# Patient Record
Sex: Male | Born: 1985 | Race: White | Hispanic: No | Marital: Single | Smoking: Never smoker
Health system: Southern US, Community
[De-identification: ages and names within clinical notes are randomized; demographics above are authoritative.]

## PROBLEM LIST (undated history)

## (undated) DIAGNOSIS — F329 Major depressive disorder, single episode, unspecified: Secondary | ICD-10-CM

## (undated) DIAGNOSIS — F419 Anxiety disorder, unspecified: Secondary | ICD-10-CM

## (undated) DIAGNOSIS — N23 Unspecified renal colic: Secondary | ICD-10-CM

## (undated) DIAGNOSIS — F32A Depression, unspecified: Secondary | ICD-10-CM

## (undated) DIAGNOSIS — A6000 Herpesviral infection of urogenital system, unspecified: Secondary | ICD-10-CM

## (undated) HISTORY — DX: Depression, unspecified: F32.A

## (undated) HISTORY — DX: Unspecified renal colic: N23

## (undated) HISTORY — PX: WISDOM TOOTH EXTRACTION: SHX21

## (undated) HISTORY — DX: Major depressive disorder, single episode, unspecified: F32.9

## (undated) HISTORY — DX: Herpesviral infection of urogenital system, unspecified: A60.00

## (undated) HISTORY — DX: Anxiety disorder, unspecified: F41.9

---

## 2005-01-08 ENCOUNTER — Ambulatory Visit: Payer: Self-pay | Admitting: Internal Medicine

## 2005-10-12 DIAGNOSIS — N23 Unspecified renal colic: Secondary | ICD-10-CM

## 2005-10-12 HISTORY — DX: Unspecified renal colic: N23

## 2005-10-19 ENCOUNTER — Ambulatory Visit: Payer: Self-pay | Admitting: Internal Medicine

## 2006-11-01 ENCOUNTER — Encounter: Payer: Self-pay | Admitting: Internal Medicine

## 2006-11-01 ENCOUNTER — Ambulatory Visit: Payer: Self-pay | Admitting: Internal Medicine

## 2006-11-04 ENCOUNTER — Ambulatory Visit: Payer: Self-pay | Admitting: Internal Medicine

## 2007-11-16 ENCOUNTER — Ambulatory Visit: Payer: Self-pay | Admitting: Internal Medicine

## 2007-11-16 DIAGNOSIS — J069 Acute upper respiratory infection, unspecified: Secondary | ICD-10-CM | POA: Insufficient documentation

## 2007-12-20 ENCOUNTER — Ambulatory Visit: Payer: Self-pay | Admitting: Internal Medicine

## 2007-12-20 DIAGNOSIS — F341 Dysthymic disorder: Secondary | ICD-10-CM

## 2010-06-24 ENCOUNTER — Ambulatory Visit
Admission: RE | Admit: 2010-06-24 | Discharge: 2010-06-24 | Payer: Self-pay | Source: Home / Self Care | Attending: Internal Medicine | Admitting: Internal Medicine

## 2010-06-24 DIAGNOSIS — R51 Headache: Secondary | ICD-10-CM | POA: Insufficient documentation

## 2010-06-24 DIAGNOSIS — R519 Headache, unspecified: Secondary | ICD-10-CM | POA: Insufficient documentation

## 2010-06-24 DIAGNOSIS — J3489 Other specified disorders of nose and nasal sinuses: Secondary | ICD-10-CM | POA: Insufficient documentation

## 2010-07-16 NOTE — Assessment & Plan Note (Signed)
Summary: sinuses//ccm   Vital Signs:  Weber profile:   25 year old male Weight:      178 pounds Temp:     97.9 degrees F oral BP sitting:   120 / 80  (right arm) Cuff size:   regular  Vitals Entered By: Duard Brady LPN (June 24, 2010 10:05 AM) CC: c/o sinus congestion - pressure and headache Is Weber Diabetic? No   CC:  c/o sinus congestion - pressure and headache.  History of Present Illness: Nathan Weber, who presents with a 5 to 6 week history for significant sinus congestion and pressure.  He has had significant headaches.  He has a history of allergic rhinitis in the past.  He denies any fever or purulent sinus drainage.  His main complaint is significant sinus congestion with associated pain.  He has been using over-the-counter decongestants, as well as nasal irrigation with saline.  Allergies: 1)  ! * Acne Meds  Past History:  Past Medical History: Reviewed history from 12/20/2007 and no changes required. anxiety depression renal colic, May 2007 genital HSV  Review of Systems       The Weber complains of headaches.  The Weber denies anorexia, fever, weight loss, weight gain, vision loss, decreased hearing, hoarseness, chest pain, syncope, dyspnea on exertion, peripheral edema, prolonged cough, hemoptysis, abdominal pain, melena, hematochezia, severe indigestion/heartburn, hematuria, incontinence, genital sores, muscle weakness, suspicious skin lesions, transient blindness, difficulty walking, depression, unusual weight change, abnormal bleeding, enlarged lymph nodes, angioedema, breast masses, and testicular masses.    Physical Exam  General:  Well-developed,well-nourished,in no acute distress; alert,appropriate and cooperative throughout examination Head:  Normocephalic and atraumatic without obvious abnormalities. No apparent alopecia or balding. Eyes:  No corneal or conjunctival inflammation noted. EOMI. Perrla. Funduscopic exam benign,  without hemorrhages, exudates or papilledema. Vision grossly normal. Ears:  External ear exam shows no significant lesions or deformities.  Otoscopic examination reveals clear canals, tympanic membranes are intact bilaterally without bulging, retraction, inflammation or discharge. Hearing is grossly normal bilaterally. right canal with cerumen Nose:  External nasal examination shows no deformity or inflammation. Nasal mucosa are pink and moist without lesions or exudates. Mouth:  Oral mucosa and oropharynx without lesions or exudates.  Teeth in good repair. Neck:  No deformities, masses, or tenderness noted. Lungs:  Normal respiratory effort, chest expands symmetrically. Lungs are clear to auscultation, no crackles or wheezes.   Impression & Recommendations:  Problem # 1:  SINUS PAIN (ICD-478.19) will treat with prednisone for 10 days, and a tapering dose.  Will continue oral decongestants and had a nasal steroid.  Will also treat with short term after an  Problem # 2:  HEADACHE (ICD-784.0)  His updated medication list for this problem includes:    Aleve 220 Mg Tabs (Naproxen sodium) .Marland Kitchen... As needed  Complete Medication List: 1)  Aleve 220 Mg Tabs (Naproxen sodium) .... As needed 2)  Fluticasone Propionate 50 Mcg/act Susp (Fluticasone propionate) .... Use daily 3)  Sertraline Hcl 50 Mg Tabs (Sertraline hcl) .... One daily 4)  Prednisone 20 Mg Tabs (Prednisone) .... One twice daily for 5 days, then one tablet daily 5)  Nasonex 50 Mcg/act Susp (Mometasone furoate) .... Used each nares daily  Weber Instructions: 1)  continue Advil sinus and nasal irrigation with saline 2)  use Afrin twice daily for 5 days 3)  Call  call if no improvement Prescriptions: PREDNISONE 20 MG TABS (PREDNISONE) one twice daily for 5 days, then one tablet daily  #15  x 0   Entered and Authorized by:   Gordy Savers  MD   Signed by:   Gordy Savers  MD on 06/24/2010   Method used:   Print then Give  to Weber   RxID:   [id number] PREDNISONE 20 MG TABS (PREDNISONE) one twice daily for 5 days, then one tablet daily  #15 x 0   Entered and Authorized by:   Gordy Savers  MD   Signed by:   Gordy Savers  MD on 06/24/2010   Method used:   Electronically to        [facility]  [phone] (retail)       [address]       [place], [place]  [address]       Ph: [phone]       Fax: [phone]   RxID:   [id number]    Orders Added: 1)  Est. Weber Level III [[id number]]

## 2010-07-22 ENCOUNTER — Telehealth: Payer: Self-pay | Admitting: *Deleted

## 2010-07-22 DIAGNOSIS — J329 Chronic sinusitis, unspecified: Secondary | ICD-10-CM

## 2010-07-22 NOTE — Telephone Encounter (Signed)
Requesting an ENT referral and LMTCB with reason.

## 2010-07-22 NOTE — Telephone Encounter (Signed)
Antibiotics did not help his sinus infection at all.

## 2010-07-22 NOTE — Telephone Encounter (Signed)
Dr. Kirtland Bouchard probably needs to evaluate---he may want other testing first

## 2010-07-23 NOTE — Telephone Encounter (Signed)
Left message for pt to expect a call with appt for CT scan.

## 2010-07-23 NOTE — Telephone Encounter (Signed)
Please schedule limited sinus CT scan and depending on the results will refer to allergy or ENT

## 2010-07-29 ENCOUNTER — Inpatient Hospital Stay: Admission: RE | Admit: 2010-07-29 | Payer: Self-pay | Source: Ambulatory Visit

## 2010-07-30 ENCOUNTER — Ambulatory Visit (INDEPENDENT_AMBULATORY_CARE_PROVIDER_SITE_OTHER)
Admission: RE | Admit: 2010-07-30 | Discharge: 2010-07-30 | Disposition: A | Discharge status: Home / Self Care | Payer: BC Managed Care – PPO | Source: Ambulatory Visit | Attending: Internal Medicine | Admitting: Internal Medicine

## 2010-07-30 DIAGNOSIS — J329 Chronic sinusitis, unspecified: Secondary | ICD-10-CM

## 2010-07-30 NOTE — Progress Notes (Signed)
Quick Note:  Please advise - this was in my in basket - unsure if it has been reviewed ______

## 2010-07-30 NOTE — Progress Notes (Signed)
Quick Note:  Spoke with pt - informed ______

## 2011-06-30 ENCOUNTER — Encounter: Payer: Self-pay | Admitting: Internal Medicine

## 2011-07-01 ENCOUNTER — Ambulatory Visit (INDEPENDENT_AMBULATORY_CARE_PROVIDER_SITE_OTHER): Payer: BC Managed Care – PPO | Admitting: Internal Medicine

## 2011-07-01 ENCOUNTER — Encounter: Payer: Self-pay | Admitting: Internal Medicine

## 2011-07-01 DIAGNOSIS — R109 Unspecified abdominal pain: Secondary | ICD-10-CM

## 2011-07-01 MED ORDER — HYOSCYAMINE SULFATE 0.125 MG SL SUBL: 0.1250 mg | SUBLINGUAL_TABLET | SUBLINGUAL | Status: DC | PRN

## 2011-07-01 NOTE — Progress Notes (Signed)
  Subjective:    Patient ID: Nathan Weber, male    DOB: September 07, 1985, 26 y.o.   MRN: [id number]  HPI  26 year old patient who presents today with a chief complaint of abdominal pain this is in the lower mid abdominal area and is described as sharp and at times crampy he states that he had similar episodes approximately twice per year. He describes nocturnal pain and had 2 days of nausea. Total duration of pain 7 days. There's been no fever vomiting or change in his bowel habits. He does have a history of renal colic. The past 2 days he has been much improved. He suggests that the pain may be related to stress    Review of Systems  Constitutional: Negative for fever, chills, appetite change and fatigue.  HENT: Negative for hearing loss, ear pain, congestion, sore throat, trouble swallowing, neck stiffness, dental problem, voice change and tinnitus.   Eyes: Negative for pain, discharge and visual disturbance.  Respiratory: Negative for cough, chest tightness, wheezing and stridor.   Cardiovascular: Negative for chest pain, palpitations and leg swelling.  Gastrointestinal: Positive for nausea and abdominal pain. Negative for vomiting, diarrhea, constipation, blood in stool and abdominal distention.  Genitourinary: Negative for urgency, hematuria, flank pain, discharge, difficulty urinating and genital sores.  Musculoskeletal: Negative for myalgias, back pain, joint swelling, arthralgias and gait problem.  Skin: Negative for rash.  Neurological: Negative for dizziness, syncope, speech difficulty, weakness, numbness and headaches.  Hematological: Negative for adenopathy. Does not bruise/bleed easily.  Psychiatric/Behavioral: Negative for behavioral problems and dysphoric mood. The patient is not nervous/anxious.        Objective:   Physical Exam  Constitutional: He is oriented to person, place, and time. He appears well-developed.  HENT:  Head: Normocephalic.  Right Ear: External ear normal.    Left Ear: External ear normal.  Eyes: Conjunctivae and EOM are normal.  Neck: Normal range of motion.  Cardiovascular: Normal rate and normal heart sounds.   Pulmonary/Chest: Breath sounds normal.  Abdominal: Soft. Bowel sounds are normal. He exhibits no distension and no mass. There is no tenderness. There is no rebound and no guarding.  Musculoskeletal: Normal range of motion. He exhibits no edema and no tenderness.  Neurological: He is alert and oriented to person, place, and time.  Psychiatric: He has a normal mood and affect. His behavior is normal.          Assessment & Plan:   Abdominal pain largely resolved.  We'll encourage a high-fiber diet at this point and clinically observed. Was given a prescription for Levsin if pain recurs

## 2011-07-01 NOTE — Patient Instructions (Signed)
High fiber diet  Call or return to clinic prn if these symptoms worsen or fail to improve as anticipated.  

## 2011-07-28 ENCOUNTER — Ambulatory Visit: Payer: BC Managed Care – PPO | Admitting: Internal Medicine

## 2011-08-11 ENCOUNTER — Encounter: Payer: Self-pay | Admitting: Gastroenterology

## 2011-08-11 ENCOUNTER — Ambulatory Visit (INDEPENDENT_AMBULATORY_CARE_PROVIDER_SITE_OTHER): Payer: BC Managed Care – PPO | Admitting: Gastroenterology

## 2011-08-11 ENCOUNTER — Other Ambulatory Visit (INDEPENDENT_AMBULATORY_CARE_PROVIDER_SITE_OTHER): Payer: BC Managed Care – PPO

## 2011-08-11 DIAGNOSIS — R109 Unspecified abdominal pain: Secondary | ICD-10-CM

## 2011-08-11 LAB — COMPREHENSIVE METABOLIC PANEL
ALT: 21 U/L (ref 0–53)
AST: 23 U/L (ref 0–37)
Creatinine, Ser: 0.9 mg/dL (ref 0.4–1.5)
Total Bilirubin: 0.5 mg/dL (ref 0.3–1.2)

## 2011-08-11 LAB — URINALYSIS
Leukocytes, UA: NEGATIVE
Specific Gravity, Urine: 1.025 (ref 1.000–1.030)
Urine Glucose: NEGATIVE
Urobilinogen, UA: 0.2 (ref 0.0–1.0)

## 2011-08-11 LAB — CBC WITH DIFFERENTIAL/PLATELET
Basophils Absolute: 0 10*3/uL (ref 0.0–0.1)
Eosinophils Absolute: 0 10*3/uL (ref 0.0–0.7)
HCT: 45.8 % (ref 39.0–52.0)
Hemoglobin: 15.3 g/dL (ref 13.0–17.0)
Lymphs Abs: 1.2 10*3/uL (ref 0.7–4.0)
MCHC: 33.3 g/dL (ref 30.0–36.0)
Monocytes Relative: 10.6 % (ref 3.0–12.0)
Neutro Abs: 3.9 10*3/uL (ref 1.4–7.7)
RDW: 12 % (ref 11.5–14.6)

## 2011-08-11 MED ORDER — PEG-KCL-NACL-NASULF-NA ASC-C 100 G PO SOLR: 1.0000 | ORAL | Status: DC

## 2011-08-11 NOTE — Progress Notes (Signed)
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 They have a 50-month-old baby daughter  Has had about 2 months of abdominal pains.  THese are low, midline.  Hurts to wear a belt.  Pain is right at beltbuckle location.  The pain is not constant. Has occurred 4 times. The episodes can last up to a week.  Associated with frequent urinating during the pain.  The pains are usually after a BM. Has a BM daily usually but has alternating constipation/diarrhea recently.  He has noted red blood on a single occasion. No blood in urine.  About 2 hours before the pain occurs he will get hot for 15 minutes.  He is tender at the site during the pain.  In 2007 he had renal stone, left sided pain, no blood in urine. He ended up passing the stone very clearly.  Tries to stay hydrated.  He tried hyosciamine which didn't help recently but did try flexeril and that helped.     Review of systems: Pertinent positive and negative review of systems were noted in the above HPI section. Complete review of systems was performed and was otherwise normal.    Past Medical History  Diagnosis Date  . Anxiety   . Depression   . Renal colic May 2007  . Genital HSV     Past Surgical History  Procedure Date  . Wisdom tooth extraction     Current Outpatient Prescriptions  Medication Sig Dispense Refill  . hyoscyamine (LEVSIN/SL) 0.125 MG SL tablet Place 1 tablet (0.125 mg total) under the tongue every 4 (four) hours as needed for cramping.  30 tablet  2    Allergies as of 08/11/2011  . (No Known Allergies)    Family History  Problem Relation Age of Onset  . Other Mother     bladder prolapse  . Diabetes Mother   . Other Father     back surgery    History   Social History  . Marital Status: Single    Spouse Name: N/A    Number of Children: 1  . Years of Education: N/A   Occupational History  . Theatre stage manager    Social History Main Topics  . Smoking status:  Never Smoker   . Smokeless tobacco: Never Used  . Alcohol Use: No  . Drug Use: No  . Sexually Active: Not on file   Other Topics Concern  . Not on file   Social History Narrative  . No narrative on file       Physical Exam: BP 114/78  Pulse 64  Ht 6\' 2"  (1.88 m)  Wt 179 lb 9.6 oz (81.466 kg)  BMI 23.06 kg/m2 Constitutional: generally well-appearing Psychiatric: alert and oriented x3 Eyes: extraocular movements intact Mouth: oral pharynx moist, no lesions Neck: supple no lymphadenopathy Cardiovascular: heart regular rate and rhythm Lungs: clear to auscultation bilaterally Abdomen: soft, nontender, nondistended, no obvious ascites, no peritoneal signs, normal bowel sounds Extremities: no lower extremity edema bilaterally Skin: no lesions on visible extremities    Assessment and plan: 26 y.o. male with  suprapubic pain, intermittently, red blood on stool and change in bowel habits recently  He should proceed with colonoscopy given the change in bowel habits and minor red blood on stool recently. Perhaps he has colitis. Unlikely that he has neoplasm. His abdominal symptoms seem more the latter are related. The exact spot of his pain is immediately over his urinary bladder. He  is very clear about this. He has had a kidney stone in the past. Today in clinic a urinalysis and urine culture.

## 2011-08-11 NOTE — Patient Instructions (Signed)
You will have labs checked today in the basement lab.  Please head down after you check out with the front desk  (urinalysis and urine culture, cbc, cmet). You will be set up for a colonoscopy. If these tests are not helpful, then would likely arrange referral to urologist given location of your pain, history of kidney stones. A copy of this information will be made available to Dr. Amador Cunas.

## 2011-08-12 LAB — URINE CULTURE
Colony Count: NO GROWTH
Organism ID, Bacteria: NO GROWTH

## 2011-08-16 ENCOUNTER — Telehealth: Payer: Self-pay | Admitting: Gastroenterology

## 2011-08-16 DIAGNOSIS — R102 Pelvic and perineal pain: Secondary | ICD-10-CM

## 2011-08-16 NOTE — Telephone Encounter (Signed)
Spoke with patient and he has not had anymore symptoms. He states he would like to see a urologist first then do the colonoscopy if needed. He states he does not have the money to do the colonoscopy at this time. Wants to know if Dr. Christella Hartigan will refer him to a urologist. Please, advise

## 2011-08-16 NOTE — Telephone Encounter (Signed)
Ok, please refer him to [facility] urology for suprapubic pain, h/o kidney stones

## 2011-08-17 ENCOUNTER — Ambulatory Visit: Payer: BC Managed Care – PPO | Admitting: Gastroenterology

## 2011-08-17 NOTE — Telephone Encounter (Signed)
Cancelled colonoscopy. Scheduled patient at [facility] with Dr. Brunilda Payor on 08/23/11 at 12:45 PM. Patient aware.

## 2011-08-23 ENCOUNTER — Other Ambulatory Visit: Payer: BC Managed Care – PPO | Admitting: Gastroenterology

## 2013-02-10 IMAGING — CT CT PARANASAL SINUSES LIMITED
1 of 3 series · 8 of 30 positions shown, 10 images · non-contrast
Comparison: None.

CLINICAL DATA: Right-sided facial pain and congestion

CT PARANASAL SINUSES WITHOUT CONTRAST
TECHNIQUE: Multidetector CT through the paranasal sinuses was
performed using the standard protocol without intravenous contrast.

[Series 4: ltd sinus 3.0 h30s · axial · 0.29mm/px · z∈[+57,+134]mm · 8 of 15 slices shown, 10 images]
[im 2/15  brain]
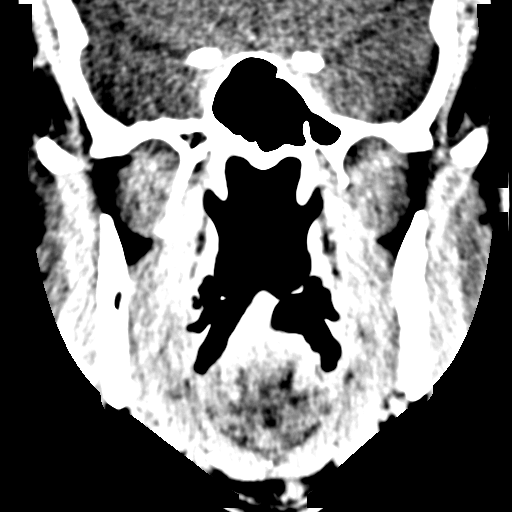
[im 2/15  bone]
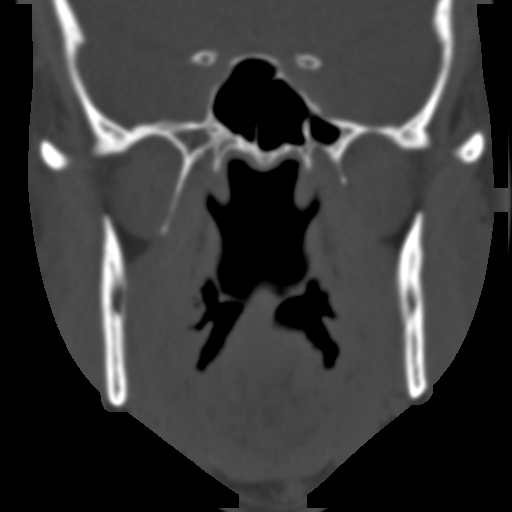
[im 4/15  bone]
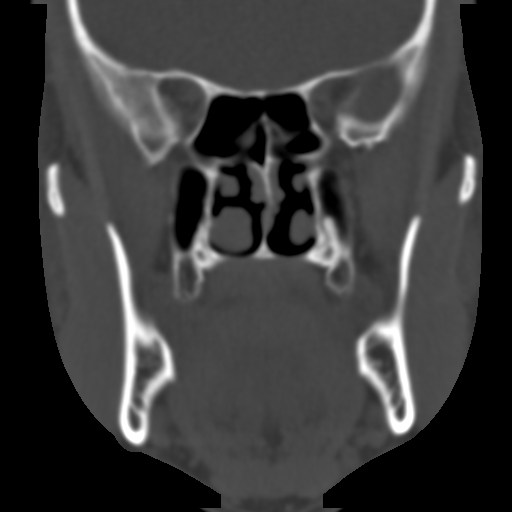
[im 5/15  bone]
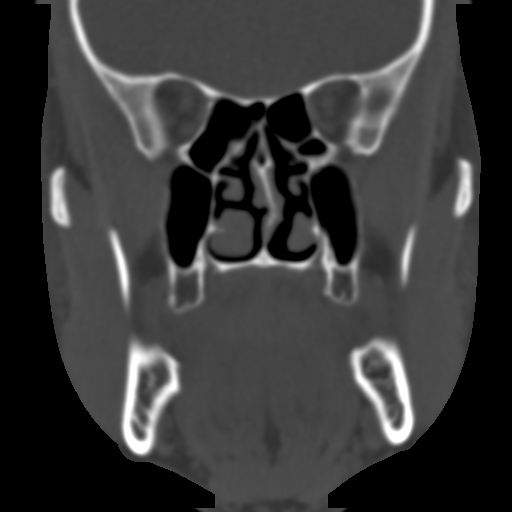
[im 7/15  bone]
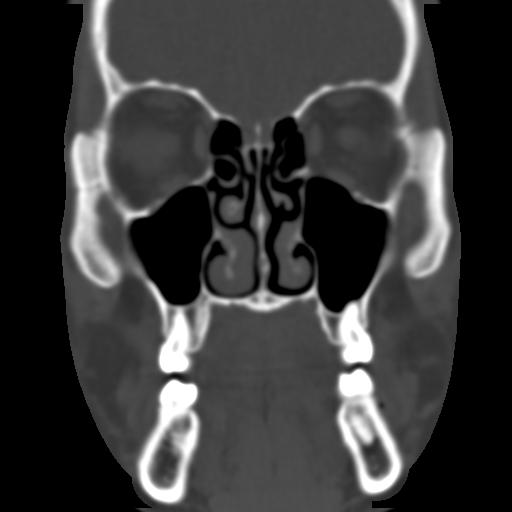
[im 8/15  brain]
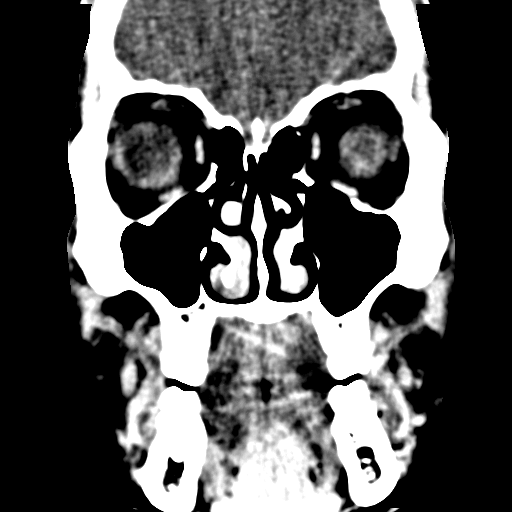
[im 8/15  bone]
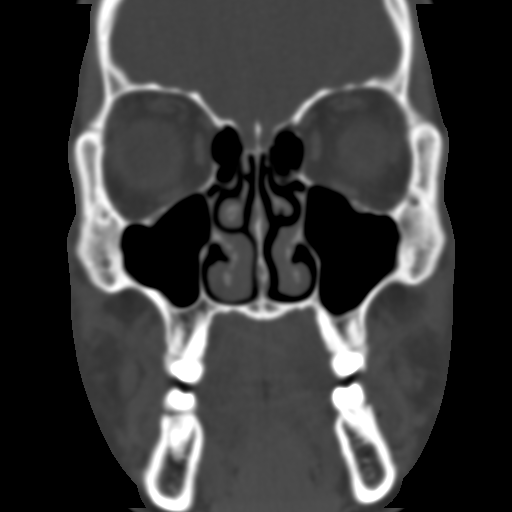
[im 10/15  bone]
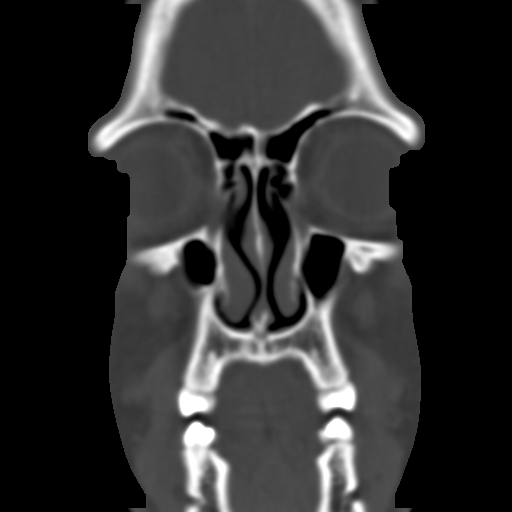
[im 11/15  bone]
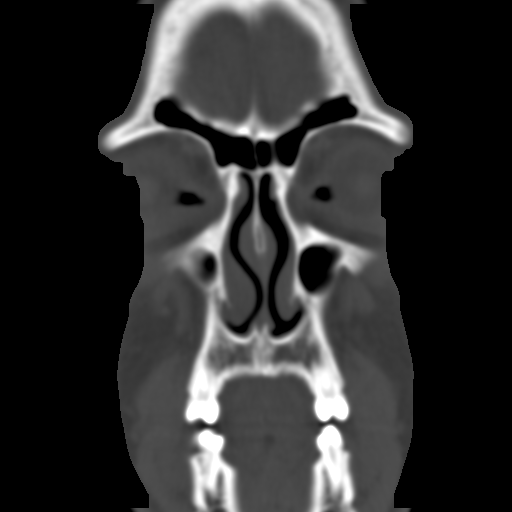
[im 13/15  bone]
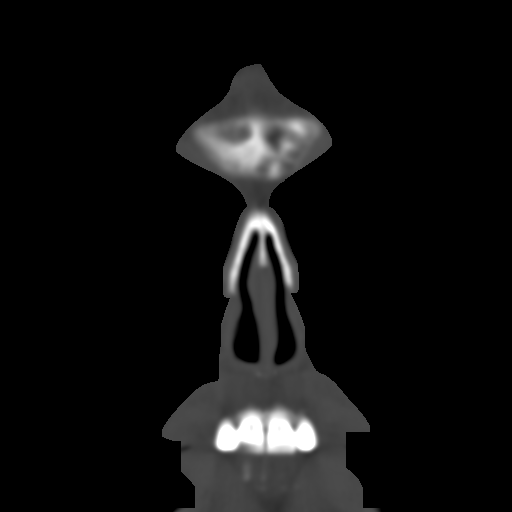

[8 of 30 positions shown; findings below may reference images not displayed]

FINDINGS: The paranasal sinuses are well pneumatized.  There is no
evidence of sinusitis.  The nasal turbinates are normal in size and
position and the nasal airway is patent.  No bony abnormality is
seen.
IMPRESSION: No evidence of sinusitis.

## 2013-12-05 ENCOUNTER — Encounter: Payer: Self-pay | Admitting: Internal Medicine

## 2013-12-05 ENCOUNTER — Ambulatory Visit (INDEPENDENT_AMBULATORY_CARE_PROVIDER_SITE_OTHER): Payer: 59 | Admitting: Internal Medicine

## 2013-12-05 VITALS — BP 112/80 | HR 92 | Temp 98.6°F | Resp 20 | Ht 74.0 in | Wt 160.0 lb

## 2013-12-05 DIAGNOSIS — F341 Dysthymic disorder: Secondary | ICD-10-CM

## 2013-12-05 DIAGNOSIS — F4323 Adjustment disorder with mixed anxiety and depressed mood: Secondary | ICD-10-CM

## 2013-12-05 MED ORDER — ESCITALOPRAM OXALATE 20 MG PO TABS: 20.0000 mg | ORAL_TABLET | Freq: Every day | ORAL | Status: DC

## 2013-12-05 MED ORDER — LORAZEPAM 0.5 MG PO TABS: 0.5000 mg | ORAL_TABLET | Freq: Two times a day (BID) | ORAL | Status: DC | PRN

## 2013-12-05 NOTE — Progress Notes (Signed)
Subjective:    Patient ID: Nathan LarssonDustin D Weber, male    DOB: Apr 27, 1986, 28 y.o.   MRN: [id number]  HPI  28 year old patient who has a history of anxiety, depression, and was last seen in 2009 for worsening depression.  He was placed on sertraline at that time, but apparently did not tolerate this medication well and did not take it for any prolonged period. The past 3 weeks.  He complains of worsening anxiety and depression.  Other complaints include insomnia, frequent intrusive thoughts, as well as anorexia.  Triggers include some marital and financial issues.  He is accompanied by his mother  Past Medical History  Diagnosis Date  . Anxiety   . Depression   . Renal colic May 2007  . Genital HSV     History   Social History  . Marital Status: Single    Spouse Name: N/A    Number of Children: 1  . Years of Education: N/A   Occupational History  . Theatre stage managerire Fighter    Social History Main Topics  . Smoking status: Never Smoker   . Smokeless tobacco: Never Used  . Alcohol Use: No  . Drug Use: No  . Sexual Activity: Not on file   Other Topics Concern  . Not on file   Social History Narrative  . No narrative on file    Past Surgical History  Procedure Laterality Date  . Wisdom tooth extraction      Family History  Problem Relation Age of Onset  . Other Mother     bladder prolapse  . Diabetes Mother   . Other Father     back surgery    No Known Allergies  No current outpatient prescriptions on file prior to visit.   No current facility-administered medications on file prior to visit.    BP 112/80  Pulse 92  Temp(Src) 98.6 F (37 C) (Oral)  Resp 20  Ht 6\' 2"  (1.88 m)  Wt 160 lb (72.576 kg)  BMI 20.53 kg/m2  SpO2 98%       Review of Systems  Constitutional: Positive for appetite change. Negative for fever, chills and fatigue.  HENT: Negative for congestion, dental problem, ear pain, hearing loss, sore throat, tinnitus, trouble swallowing and voice  change.   Eyes: Negative for pain, discharge and visual disturbance.  Respiratory: Negative for cough, chest tightness, wheezing and stridor.   Cardiovascular: Negative for chest pain, palpitations and leg swelling.  Gastrointestinal: Negative for nausea, vomiting, abdominal pain, diarrhea, constipation, blood in stool and abdominal distention.  Genitourinary: Negative for urgency, hematuria, flank pain, discharge, difficulty urinating and genital sores.  Musculoskeletal: Negative for arthralgias, back pain, gait problem, joint swelling, myalgias and neck stiffness.  Skin: Negative for rash.  Neurological: Negative for dizziness, syncope, speech difficulty, weakness, numbness and headaches.  Hematological: Negative for adenopathy. Does not bruise/bleed easily.  Psychiatric/Behavioral: Positive for sleep disturbance and dysphoric mood. Negative for suicidal ideas and behavioral problems. The patient is nervous/anxious.        Objective:   Physical Exam  Constitutional: He appears well-developed and well-nourished. No distress.  Psychiatric: His behavior is normal. Judgment and thought content normal.  Mildly depressed mood          Assessment & Plan:   Mixed  anxiety, depression/insomnia Behavioral health referral.  Discussed.  Information dispensed.  He will consider We'll start drug therapy with lorazepam  at bedtime to assist with sleep.  Also, be placed on Lexapro 10 mg every  morning.  Recheck 6 weeks or as needed

## 2013-12-05 NOTE — Patient Instructions (Signed)
Return in 6 weeks for follow up

## 2013-12-05 NOTE — Progress Notes (Signed)
Pre-visit discussion using our clinic review tool. No additional management support is needed unless otherwise documented below in the visit note.  

## 2014-01-16 ENCOUNTER — Ambulatory Visit: Payer: 59 | Admitting: Internal Medicine

## 2014-03-07 ENCOUNTER — Ambulatory Visit: Payer: 59 | Admitting: Psychology

## 2014-04-26 ENCOUNTER — Ambulatory Visit (INDEPENDENT_AMBULATORY_CARE_PROVIDER_SITE_OTHER): Payer: 59 | Admitting: Psychology

## 2014-04-26 DIAGNOSIS — F42 Obsessive-compulsive disorder: Secondary | ICD-10-CM

## 2014-07-03 ENCOUNTER — Encounter: Payer: Self-pay | Admitting: Podiatrist

## 2014-07-03 ENCOUNTER — Ambulatory Visit: Payer: Self-pay

## 2014-07-03 ENCOUNTER — Ambulatory Visit (INDEPENDENT_AMBULATORY_CARE_PROVIDER_SITE_OTHER): Payer: 59 | Admitting: Podiatrist

## 2014-07-03 DIAGNOSIS — B351 Tinea unguium: Secondary | ICD-10-CM

## 2014-07-03 DIAGNOSIS — B353 Tinea pedis: Secondary | ICD-10-CM

## 2014-07-03 MED ORDER — TERBINAFINE HCL 250 MG PO TABS: 250.0000 mg | ORAL_TABLET | Freq: Every day | ORAL | Status: AC

## 2014-07-03 NOTE — Progress Notes (Signed)
   Subjective:    Patient ID: Nathan LarssonDustin D Weber, male    DOB: [birth year], 29 y.o.   MRN: [id number]  HPI Comments: "I have bad toenails"  Patient c/o thick, discolored toenails left foot for several years. The skin on the bottom is itching and red. Tried several OTC meds-no help.     Review of Systems  HENT: Positive for sinus pressure.   Allergic/Immunologic: Positive for environmental allergies.  All other systems reviewed and are negative.      Objective:   Physical Exam Patient is awake, alert, and oriented x 3.  In no acute distress.  Vascular status is intact with palpable pedal pulses at 2/4 DP and PT bilateral and capillary refill time within normal limits. Neurological sensation is also intact bilaterally via Semmes Weinstein monofilament at 5/5 sites. Light touch, vibratory sensation, Achilles tendon reflex is intact. Dermatological exam reveals skin flakiness with a mildly red base in a moccasin like distribution on the left foot only.  Mild interdigital maceration also noted.  Nails on the left foot are yellow, brown, thick, discolored, dystrophic and do appear clinically mycotic.   Musculature intact with dorsiflexion, plantarflexion, inversion, eversion.    Assessment & Plan:  Tinea pedis and onychomycosis  Plan:  Recommended oral therapy considering his overall good health and the extend of his condition in the nails and skin.  Took a sample of the nail and sent for culture through [facility].  Will go ahead and start him on lamisil and if we need to switch, will let him know.  He is getting his liver function tested at work and will fax over the results.  Will retest in 1 month after on the medication.

## 2014-07-03 NOTE — Patient Instructions (Addendum)
Onychomycosis/Fungal Toenails  WHAT IS IT? An infection that lies within the keratin of your nail plate that is caused by a fungus.  WHY ME? Fungal infections affect all ages, sexes, races, and creeds.  There may be many factors that predispose you to a fungal infection such as age, coexisting medical conditions such as diabetes, or an autoimmune disease; stress, medications, fatigue, genetics, etc.  Bottom line: fungus thrives in a warm, moist environment and your shoes offer such a location.  IS IT CONTAGIOUS? Theoretically, yes.  You do not want to share shoes, nail clippers or files with someone who has fungal toenails.  Walking around barefoot in the same room or sleeping in the same bed is unlikely to transfer the organism.  It is important to realize, however, that fungus can spread easily from one nail to the next on the same foot.  HOW DO WE TREAT THIS?  There are several ways to treat this condition.  Treatment may depend on many factors such as age, medications, pregnancy, liver and kidney conditions, etc.  It is best to ask your doctor which options are available to you.  1. No treatment.   Unlike many other medical concerns, you can live with this condition.  However for many people this can be a painful condition and may lead to ingrown toenails or a bacterial infection.  It is recommended that you keep the nails cut short to help reduce the amount of fungal nail. 2. Topical treatment. - there are new topicals on the market that have had good outcomes with fungal nails. Usually the infection must be mild to only need a topical medication.  The topical is applied daily to an un painted nail.   3.  Oral antifungal medications.  With an 80-90% cure rate, the most common oral medication requires 3 to 4 months of therapy and stays in your system for a year as the new nail grows out.  Oral antifungal medications do require blood work to make sure it is a safe drug for you.  A liver function panel  will be performed prior to starting the medication and after the first month of treatment.  It is important to have the blood work performed to avoid any harmful side effects. This medication can have harmful side effects and isn't recommended for all patients, especially those on multiple medications. 4. Laser therapy-  A good option for a very new or minor toenail infection.  Usually used in combination with a topical.  [facility] is the only practice to offer the laser in the area and we have treatment plans depending on the number of toenails effected.  This is an out of pocket service as insurance will not pay for the procedure as they deem it cosmetic.   5. Permanent Nail Avulsion.  Removing the entire nail so that a new nail will not grow back.    ** get your bloodwork done within 4 weeks of starting the medication..  We will call you with the results of the fungal culture

## 2014-07-05 ENCOUNTER — Ambulatory Visit: Payer: Self-pay | Admitting: Podiatrist

## 2014-07-29 ENCOUNTER — Telehealth: Payer: Self-pay | Admitting: *Deleted

## 2014-07-29 ENCOUNTER — Encounter: Payer: Self-pay | Admitting: Podiatrist

## 2014-07-29 NOTE — Telephone Encounter (Signed)
-----   Message from Delories HeinzKathryn P Egerton, DPM sent at 07/23/2014  8:43 PM EST ----- Regarding: fungal culture result Could you call Mr. Nathan Weber and let him know his toenail culture is positive for fungus and that the lamisil I put him on is the best medication to erradicate it.  Remind him to take the lamisil every day and to get his blood test done again after taking the medication for around a month.  Also have him call if he doesn't see a definite difference in 6 months,  I may have to extend the medication for him a little at that time.   Thanks!  E

## 2014-07-29 NOTE — Telephone Encounter (Signed)
I left messages for the patient to give me a call back.  I need to inform him that his lab came back positive for fungus per Dr. Irving ShowsEgerton.

## 2014-07-29 NOTE — Telephone Encounter (Signed)
"  I'm returning your call."  I called to inform you that Dr. Irving ShowsEgerton got your culture results back and she said you do have fungus.  She said keep taking the Lamisil everyday.  After you take it for a month she wants you to have lab work again.  She said if you haven't seen any improvement in 6 months to let her know.  She may extend the medication a little longer.  "Okay, sounds good.  Thanks for calling."
